# Patient Record
Sex: Female | Born: 1985 | Race: White | Hispanic: No | Marital: Single | Smoking: Current every day smoker
Health system: Southern US, Community
[De-identification: ages and names within clinical notes are randomized; demographics above are authoritative.]

## PROBLEM LIST (undated history)

## (undated) HISTORY — PX: HALO APPLICATION: SHX1720

## (undated) HISTORY — PX: BACK SURGERY: SHX140

---

## 2019-12-03 ENCOUNTER — Other Ambulatory Visit: Payer: Self-pay

## 2019-12-03 DIAGNOSIS — Z20822 Contact with and (suspected) exposure to covid-19: Secondary | ICD-10-CM

## 2019-12-04 LAB — NOVEL CORONAVIRUS, NAA: SARS-CoV-2, NAA: NOT DETECTED

## 2020-04-19 ENCOUNTER — Encounter (HOSPITAL_COMMUNITY): Payer: Self-pay | Admitting: Emergency Medicine

## 2020-04-19 ENCOUNTER — Emergency Department (HOSPITAL_COMMUNITY)
Admission: EM | Admit: 2020-04-19 | Discharge: 2020-04-20 | Disposition: A | Discharge status: Home / Self Care | Payer: Medicaid Other | Attending: Emergency Medicine | Admitting: Emergency Medicine

## 2020-04-19 ENCOUNTER — Other Ambulatory Visit: Payer: Self-pay

## 2020-04-19 ENCOUNTER — Emergency Department (HOSPITAL_COMMUNITY): Payer: Medicaid Other

## 2020-04-19 DIAGNOSIS — Z3A19 19 weeks gestation of pregnancy: Secondary | ICD-10-CM | POA: Insufficient documentation

## 2020-04-19 DIAGNOSIS — O99332 Smoking (tobacco) complicating pregnancy, second trimester: Secondary | ICD-10-CM | POA: Insufficient documentation

## 2020-04-19 DIAGNOSIS — S61451A Open bite of right hand, initial encounter: Secondary | ICD-10-CM | POA: Insufficient documentation

## 2020-04-19 DIAGNOSIS — Y92009 Unspecified place in unspecified non-institutional (private) residence as the place of occurrence of the external cause: Secondary | ICD-10-CM | POA: Insufficient documentation

## 2020-04-19 DIAGNOSIS — S41152A Open bite of left upper arm, initial encounter: Secondary | ICD-10-CM | POA: Diagnosis not present

## 2020-04-19 DIAGNOSIS — S71152A Open bite, left thigh, initial encounter: Secondary | ICD-10-CM | POA: Insufficient documentation

## 2020-04-19 DIAGNOSIS — S61452A Open bite of left hand, initial encounter: Secondary | ICD-10-CM | POA: Insufficient documentation

## 2020-04-19 DIAGNOSIS — W540XXA Bitten by dog, initial encounter: Secondary | ICD-10-CM | POA: Diagnosis not present

## 2020-04-19 DIAGNOSIS — Y9389 Activity, other specified: Secondary | ICD-10-CM | POA: Insufficient documentation

## 2020-04-19 DIAGNOSIS — O9A212 Injury, poisoning and certain other consequences of external causes complicating pregnancy, second trimester: Secondary | ICD-10-CM | POA: Insufficient documentation

## 2020-04-19 DIAGNOSIS — Z79899 Other long term (current) drug therapy: Secondary | ICD-10-CM | POA: Diagnosis not present

## 2020-04-19 DIAGNOSIS — Y999 Unspecified external cause status: Secondary | ICD-10-CM | POA: Diagnosis not present

## 2020-04-19 DIAGNOSIS — F17200 Nicotine dependence, unspecified, uncomplicated: Secondary | ICD-10-CM | POA: Insufficient documentation

## 2020-04-19 MED ORDER — AZITHROMYCIN 250 MG PO TABS
500.0000 mg | ORAL_TABLET | Freq: Once | ORAL | Status: AC
  Administered 2020-04-20: 500 mg via ORAL
  Filled 2020-04-19: qty 2

## 2020-04-19 MED ORDER — ACETAMINOPHEN 325 MG PO TABS
650.0000 mg | ORAL_TABLET | Freq: Once | ORAL | Status: AC
  Administered 2020-04-20: 650 mg via ORAL
  Filled 2020-04-19: qty 2

## 2020-04-19 NOTE — ED Triage Notes (Signed)
Pt c/o dog bites to the right hand left arm and left leg. [employer] spoke with pt in the waiting room. Pt states she is [redacted] weeks pregnant.

## 2020-04-19 NOTE — ED Provider Notes (Signed)
[facility] EMERGENCY DEPARTMENT Provider Note   CSN: [id number] Arrival date & time: 04/19/20  1936   Time seen 11:25 PM  History Chief Complaint  Patient presents with  . Animal Bite    Gina Simpson is a 34 y.o. female.  HPI   Patient states her roommate got a pit bull about 4 months ago.  She states some other dogs were in the crates barking and as she walked by she yelled at them to quit barking and the pitbull who was out of his cage attacked her and bit her multiple times.  She states she was bitten in her right hand, left hand, left upper arm, and her left thigh.  Patient is right-handed.  She states her right fifth finger fingernail was ripped off.  Patient states her last tetanus was 2 years ago.  She states police were called and the pitbull has been taken to the pound for euthanasia because he has bitten before.  Patient is G3, P2 Ab0, she states she is 19 weeks +3 days, EDC is September 18.  She has had a normal pregnancy to date.  She is followed at [facility] family medicine at [facility]  PCP Patient, No Pcp Per [facility]  History reviewed. No pertinent past medical history.  There are no problems to display for this patient.   Past Surgical History:  Procedure Laterality Date  . BACK SURGERY    . HALO APPLICATION       OB History    Gravida  1   Para      Term      Preterm      AB      Living        SAB      TAB      Ectopic      Multiple      Live Births              No family history on file.  Social History   Tobacco Use  . Smoking status: Current Every Day Smoker  . Smokeless tobacco: Never Used  Substance Use Topics  . Alcohol use: Not Currently  . Drug use: Not Currently    Home Medications Prior to Admission medications   Medication Sig Start Date End Date Taking? Authorizing Provider  Prenat w/o [id number]-DHA (CITRANATAL 90 DHA) 90-1 & 300 MG MISC Take 1 capsule by mouth in the morning and at bedtime. 02/19/20   Yes [provider]  azithromycin (ZITHROMAX) 250 MG tablet Take 1 po QD until gone 04/20/20   Rolland Porter, MD    Allergies    Codeine and Penicillins  Review of Systems   Review of Systems  All other systems reviewed and are negative.   Physical Exam Updated Vital Signs BP 123/75 (BP Location: Right Arm)   Pulse 96   Temp 98.2 F (36.8 C) (Oral)   Resp 18   Ht 5' (1.524 m)   Wt 67.6 kg   SpO2 99%   BMI 29.10 kg/m   Physical Exam Vitals and nursing note reviewed.  Constitutional:      Appearance: Normal appearance. She is normal weight.  HENT:     Head: Normocephalic and atraumatic.  Eyes:     Extraocular Movements: Extraocular movements intact.     Conjunctiva/sclera: Conjunctivae normal.  Cardiovascular:     Rate and Rhythm: Normal rate.  Pulmonary:     Effort: Pulmonary effort is normal. No respiratory distress.  Musculoskeletal:        General: Swelling and tenderness present.     Cervical back: Normal range of motion.     Comments: Patient has bite marks with bruising on her lateral proximal left thigh and 2 areas, please see photo.  Patient has a laceration over the proximal phalanx of the right index finger that is through the dermis.  That whole finger is swollen.  She does have good range of motion.  Patient has a puncture mark on her right index finger on the ulnar aspect near the PIP joint.  She also has avulsion of the right fifth finger nail.  Patient has multiple puncture wounds on her left hand and fingers with a lot of swelling especially around the MCP joint of the left index finger and the PIP joint of the left index finger and just distal to the MCP joint of the middle finger.  She has good range of motion with pain.  Patient has a bite mark of her left upper inner arm with a lot of bruising.  Skin:    General: Skin is warm and dry.  Neurological:     General: No focal deficit present.     Mental Status: She is alert and oriented to  person, place, and time.     Cranial Nerves: No cranial nerve deficit.  Psychiatric:        Mood and Affect: Mood normal.        Behavior: Behavior normal.        Thought Content: Thought content normal.    Left thigh     Right Index finger    Right fingers    Right hand dorsum   Left hand     Left upper inner arm     ED Results / Procedures / Treatments   Labs (all labs ordered are listed, but only abnormal results are displayed) Labs Reviewed - No data to display  EKG None  Radiology DG Hand Complete Left  Result Date: 04/20/2020 CLINICAL DATA:  Status post dog bite. EXAM: LEFT HAND - COMPLETE 3+ VIEW COMPARISON:  None. FINDINGS: There is no evidence of fracture or dislocation. There is no evidence of arthropathy or other focal bone abnormality. A small amount of soft tissue air is seen in between the distal aspects of the second and third left metacarpals. IMPRESSION: Small amount of soft tissue air in between the second and third left metacarpals. Electronically Signed   By: Aram Candela M.D.   On: 04/20/2020 00:48   DG Hand Complete Right  Result Date: 04/20/2020 CLINICAL DATA:  Status post dog bite. EXAM: RIGHT HAND - COMPLETE 3+ VIEW COMPARISON:  None. FINDINGS: There is no evidence of fracture or dislocation. A radiopaque ring (i.e. Jewelry) is seen overlying the proximal phalanx of the fourth right finger. There is no evidence of arthropathy or other focal bone abnormality. There is mild soft tissue swelling along the proximal aspect of the second right finger. A small superficial soft tissue defect is seen along the distal aspect of the fifth right finger. IMPRESSION: Soft tissue swelling along the proximal aspect of the second right finger with a small superficial soft tissue defect involving the distal aspect of the fifth right finger. Electronically Signed   By: Aram Candela M.D.   On: 04/20/2020 00:50    Procedures Procedures (including  critical care time)  Medications Ordered in ED Medications  acetaminophen (TYLENOL) tablet 650 mg (650 mg Oral Given 04/20/20 0007)  azithromycin (ZITHROMAX) tablet 500 mg (500 mg Oral Given 04/20/20 0007)    ED Course  I have reviewed the triage vital signs and the nursing notes.  Pertinent labs & imaging results that were available during my care of the patient were reviewed by me and considered in my medical decision making (see chart for details).    MDM Rules/Calculators/A&P                      Patient's tetanus is up-to-date.  Her wounds were cleaned.  She was given acetaminophen for pain.  She was started on Zithromax after reviewing online what would be safe with pregnancy with a penicillin allergy.  X-rays were obtained to look for underlying fracture of her hands.  Fetal heart rate was done and was 152   Final Clinical Impression(s) / ED Diagnoses Final diagnoses:  Dog bite of right hand, initial encounter  Dog bite of left hand, initial encounter  Dog bite of left upper arm, initial encounter  Dog bite of left thigh, initial encounter  [redacted] weeks gestation of pregnancy    Rx / DC Orders ED Discharge Orders         Ordered    azithromycin (ZITHROMAX) 250 MG tablet     04/20/20 0058        OTC acetaminophen  Plan discharge  Devoria Albe, MD, Concha Pyo, MD 04/20/20 [id number]

## 2020-04-20 MED ORDER — BACITRACIN-NEOMYCIN-POLYMYXIN 400-5-5000 EX OINT
TOPICAL_OINTMENT | Freq: Once | CUTANEOUS | Status: AC
  Filled 2020-04-20: qty 10

## 2020-04-20 MED ORDER — AZITHROMYCIN 250 MG PO TABS: ORAL_TABLET | ORAL | 0 refills | Status: AC

## 2020-04-20 MED ORDER — POVIDONE-IODINE 10 % EX SOLN
CUTANEOUS | Status: AC
  Filled 2020-04-20: qty 45

## 2020-04-20 NOTE — Discharge Instructions (Addendum)
Try to keep the wounds clean and dry.  Do use triple antibiotic ointment on the wounds such as bacitracin to help prevent infection.  Take the antibiotic until gone, unfortunately dog bites can get infected.   With your pregnancy can safely take acetaminophen 650 mg every 6 hours for pain.  Ice packs will help for comfort.  Recheck if any of your wounds get infected.  Some of the bites seen close to the joints in your fingers, please follow-up with Dr. Romeo Apple, the orthopedist in [place] this week.  Return to the emergency department if you get worsening pain, redness, fever, red streaks, swelling or the wound start draining pus.  Keep your appointments with your [facility] doctor.

## 2020-04-20 NOTE — ED Notes (Signed)
Wounds on L thigh and L. inner arm area cleaned with betadine. Neosporin applied to wounds afterwards and bandage applied. Hands are soaking bilaterally in a mixture of warm water, betadine, and peroxide at this time.

## 2020-04-20 NOTE — ED Notes (Signed)
Pt's wounds on bilateral hands cleaned, dried, and bandaged.

## 2020-04-21 ENCOUNTER — Ambulatory Visit: Payer: Medicaid Other | Attending: Internal Medicine

## 2020-04-21 ENCOUNTER — Ambulatory Visit: Payer: Medicaid Other | Admitting: Orthopedic Surgery

## 2020-04-21 ENCOUNTER — Other Ambulatory Visit: Payer: Self-pay

## 2020-04-21 DIAGNOSIS — Z20822 Contact with and (suspected) exposure to covid-19: Secondary | ICD-10-CM

## 2020-04-22 ENCOUNTER — Telehealth: Payer: Self-pay | Admitting: Radiology

## 2020-04-22 LAB — SARS-COV-2, NAA 2 DAY TAT

## 2020-04-22 LAB — NOVEL CORONAVIRUS, NAA: SARS-CoV-2, NAA: DETECTED — AB

## 2020-04-22 NOTE — Telephone Encounter (Signed)
Patient presented for her appointment in our clinic yesterday, and neglected to tell us that she had a positive COVID person living in her household.  Upon discovering this, I advised her that we could not see her in the office, and that she would need to leave, and we would need to discuss treatment options by phone.  I called and spoke to patient after she left clinic in attempt to schedule her for a virtual visit.  She declined this.  I have spoken with Dr Romeo Apple and he has reviewed chart, and we can only do a virtual visit at this time, as patient was seen and treated in the ED and wounds were cleaned/dressed.  Patient did go for a COVID test yesterday.  COVID test was pending at the time I last spoke to her.  As of this afternoon, COVID test has been reported to patient as positive, for her and her daughter both.  I have sent a text to her to sign up for MyChart so we can arrange a virtual visit for her. I have asked her to call me back and let me know once done.  I also told her we would look at scheduling an in office appt approximately 14 days from now, for further eval.  Patient is aware she can seek treatment at the ED if needed in the interim.  Will await her call and proceed with scheduling the virtual visit with Dr Romeo Apple.

## 2020-05-04 ENCOUNTER — Ambulatory Visit: Payer: Medicaid Other | Attending: Internal Medicine

## 2020-05-04 ENCOUNTER — Other Ambulatory Visit: Payer: Self-pay

## 2020-05-04 DIAGNOSIS — Z20822 Contact with and (suspected) exposure to covid-19: Secondary | ICD-10-CM

## 2020-05-05 LAB — SARS-COV-2, NAA 2 DAY TAT

## 2020-05-05 LAB — NOVEL CORONAVIRUS, NAA: SARS-CoV-2, NAA: NOT DETECTED

## 2020-05-06 ENCOUNTER — Telehealth: Payer: Self-pay | Admitting: General Practice

## 2020-05-06 NOTE — Telephone Encounter (Signed)
Negative COVID results given. Patient results "NOT Detected." Caller expressed understanding. ° °

## 2021-05-22 IMAGING — DX DG HAND COMPLETE 3+V*R*
3 series · 3 of 3 positions shown · non-contrast
Comparison: None.

CLINICAL DATA: Status post dog bite.

EXAM:
RIGHT HAND - COMPLETE 3+ VIEW

[hand ap]
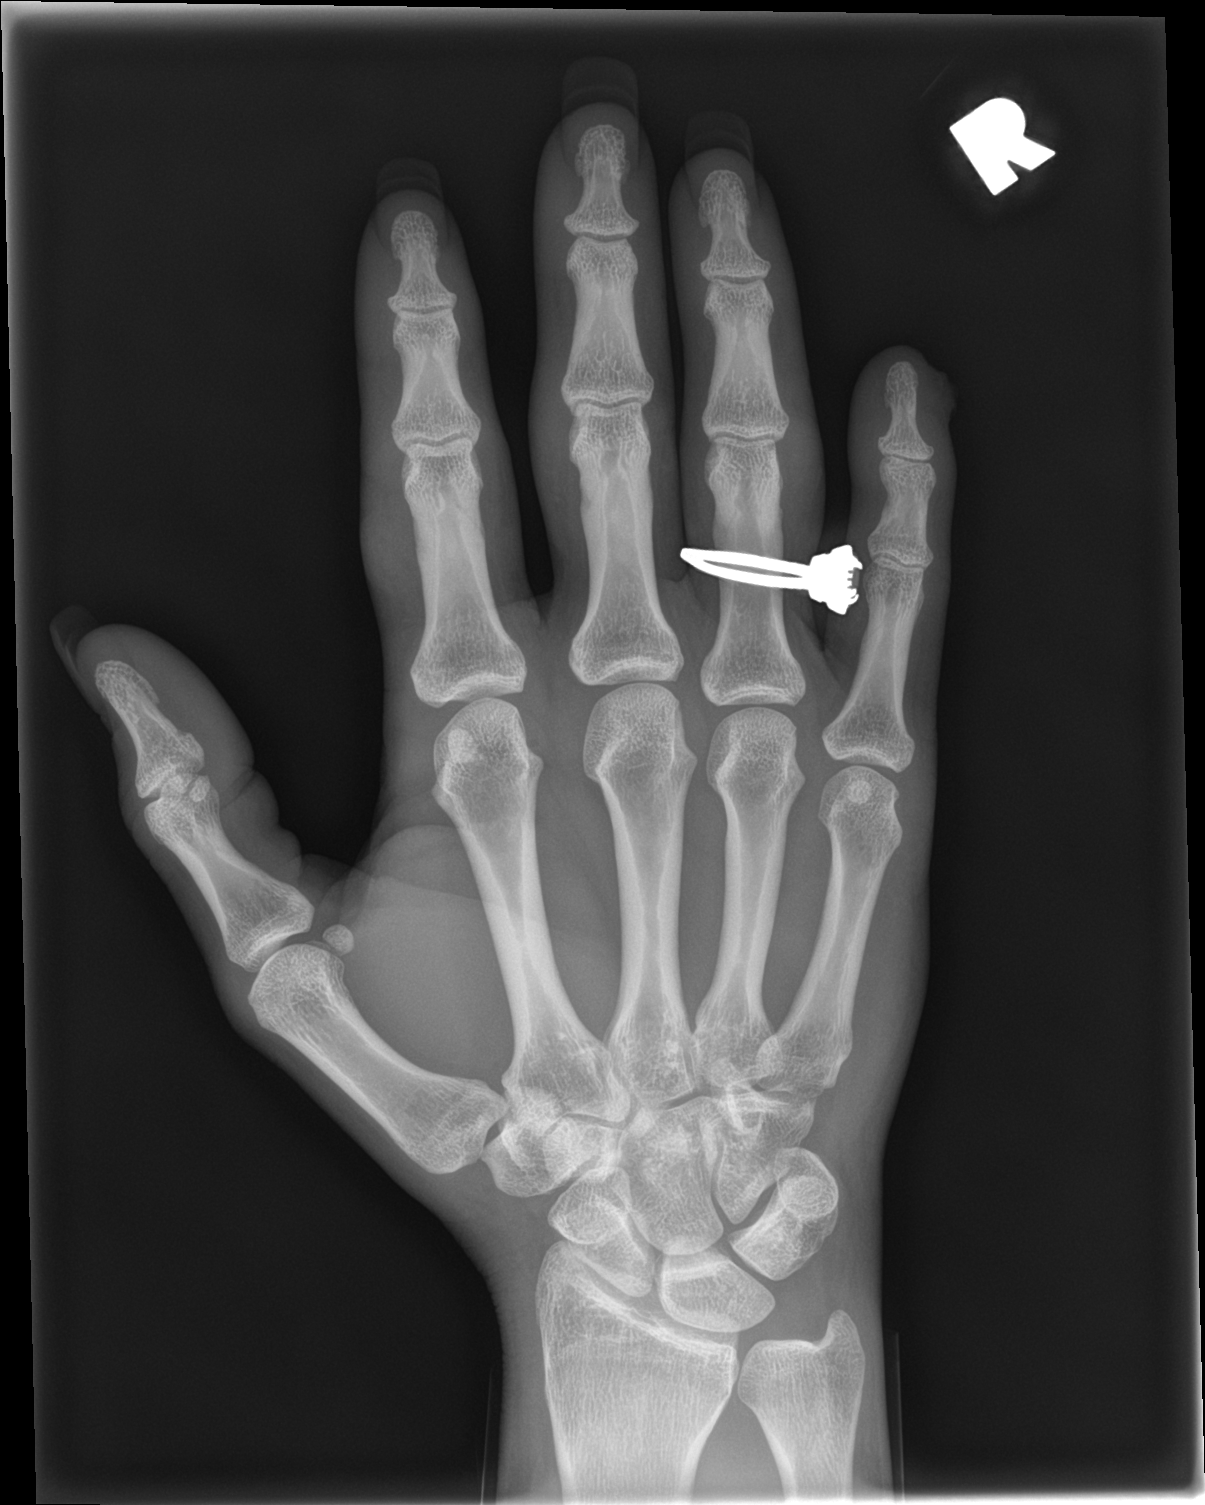

[hand obl]
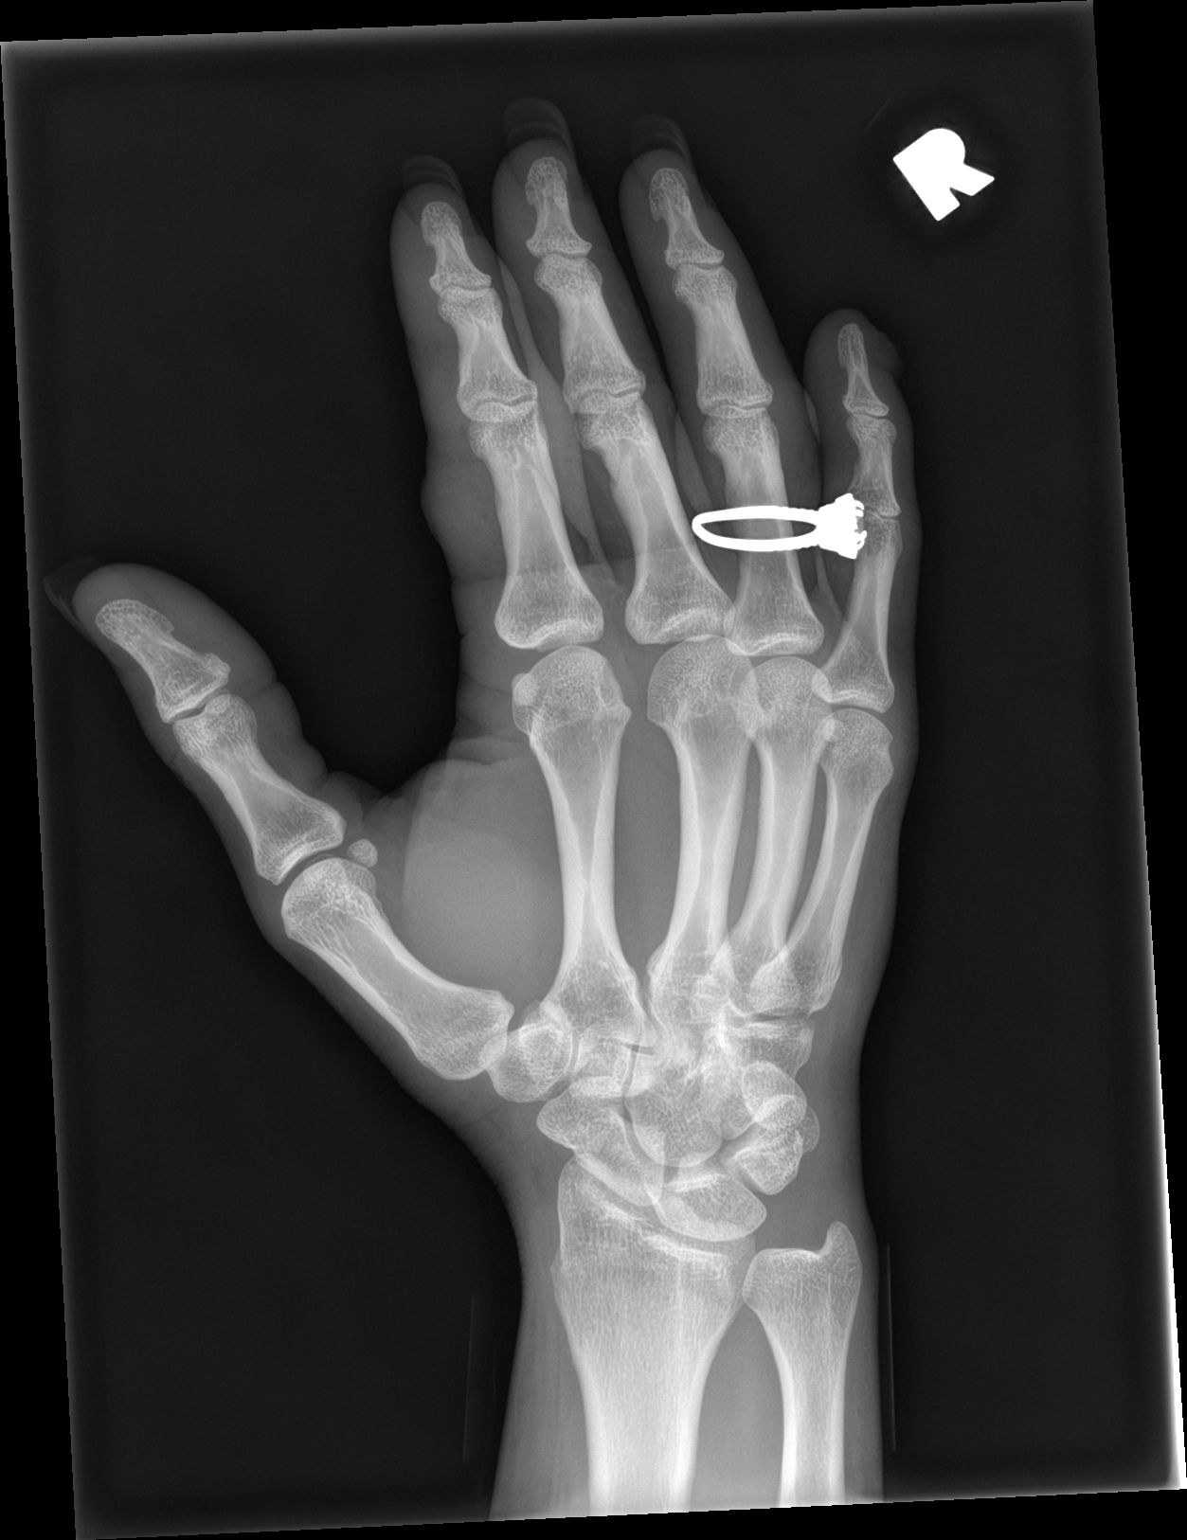

[hand lat]
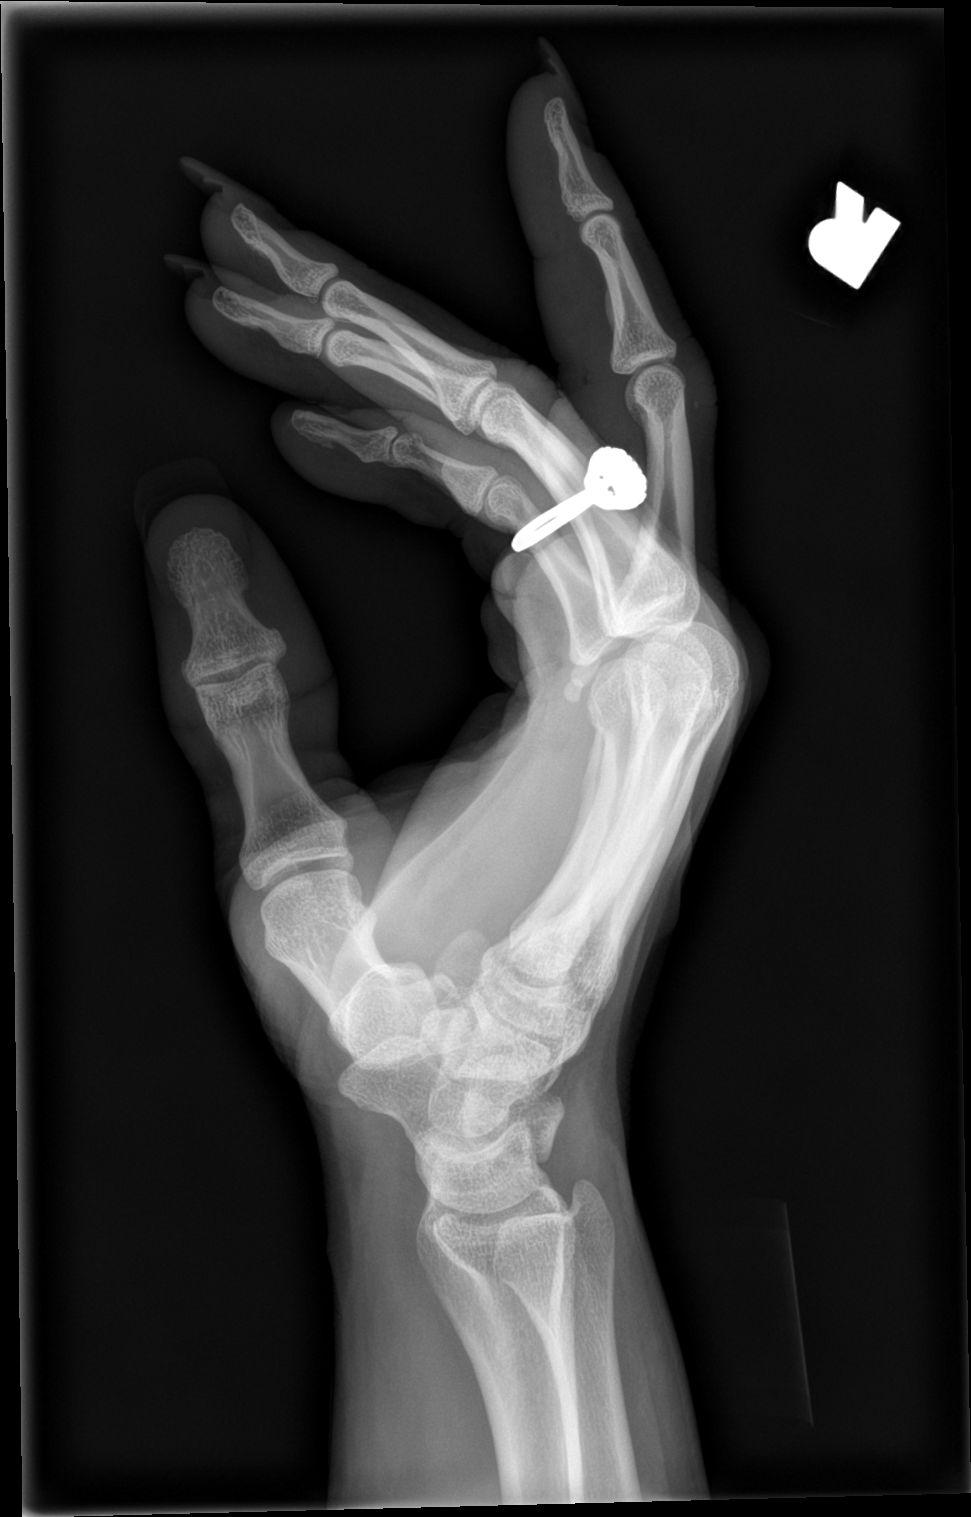

[3 of 3 positions shown; findings below may reference images not displayed]

FINDINGS: There is no evidence of fracture or dislocation. A radiopaque ring
(i.e. Jewelry) is seen overlying the proximal phalanx of the fourth
right finger. There is no evidence of arthropathy or other focal
bone abnormality. There is mild soft tissue swelling along the
proximal aspect of the second right finger. A small superficial soft
tissue defect is seen along the distal aspect of the fifth right
finger.
IMPRESSION: Soft tissue swelling along the proximal aspect of the second right
finger with a small superficial soft tissue defect involving the
distal aspect of the fifth right finger.
